# Patient Record
Sex: Female | Born: 1960 | Race: Black or African American | Hispanic: No | Marital: Married | Smoking: Former smoker
Health system: Southern US, Community
[De-identification: ages and names within clinical notes are randomized; demographics above are authoritative.]

## PROBLEM LIST (undated history)

## (undated) DIAGNOSIS — E119 Type 2 diabetes mellitus without complications: Secondary | ICD-10-CM

## (undated) DIAGNOSIS — T7840XA Allergy, unspecified, initial encounter: Secondary | ICD-10-CM

## (undated) HISTORY — DX: Type 2 diabetes mellitus without complications: E11.9

## (undated) HISTORY — PX: TONSILLECTOMY: SUR1361

## (undated) HISTORY — PX: OTHER SURGICAL HISTORY: SHX169

## (undated) HISTORY — DX: Allergy, unspecified, initial encounter: T78.40XA

## (undated) HISTORY — PX: LASIK: SHX215

---

## 2007-08-25 ENCOUNTER — Encounter: Admission: RE | Admit: 2007-08-25 | Discharge: 2007-08-25 | Payer: Self-pay | Admitting: Internal Medicine

## 2008-08-27 ENCOUNTER — Encounter: Admission: RE | Admit: 2008-08-27 | Discharge: 2008-08-27 | Payer: Self-pay | Admitting: Internal Medicine

## 2010-07-20 ENCOUNTER — Encounter: Payer: Self-pay | Admitting: Family Medicine

## 2015-01-25 ENCOUNTER — Other Ambulatory Visit: Payer: Self-pay | Admitting: Ophthalmology

## 2015-01-25 DIAGNOSIS — M25512 Pain in left shoulder: Secondary | ICD-10-CM

## 2015-01-28 ENCOUNTER — Ambulatory Visit
Admission: RE | Admit: 2015-01-28 | Discharge: 2015-01-28 | Disposition: A | Discharge status: Home / Self Care | Source: Ambulatory Visit | Attending: Ophthalmology | Admitting: Ophthalmology

## 2015-01-28 ENCOUNTER — Other Ambulatory Visit: Payer: Self-pay | Admitting: Ophthalmology

## 2015-01-28 DIAGNOSIS — M25512 Pain in left shoulder: Secondary | ICD-10-CM

## 2016-12-08 IMAGING — CT CT CHEST LIMITED W/O CM
2 of 5 series · 9 of 36 positions shown, 11 images · non-contrast
Comparison: None.

CLINICAL DATA: Right worse than left pain and swelling of the
sternoclavicular joints for 3 weeks. No known injury. Initial
encounter.

EXAM:
3-DIMENSIONAL CT IMAGE RENDERING ON INDEPENDENT WORKSTATION
TECHNIQUE: 3-dimensional CT images were rendered by post-processing of the
original CT data on an independent workstation. The 3-dimensional CT
images were interpreted and findings were reported in the
accompanying complete CT report for this study

[Series 7: cor · coronal · 0.22mm/px · 3 of 52 slices shown]
[im 11/52  lung]
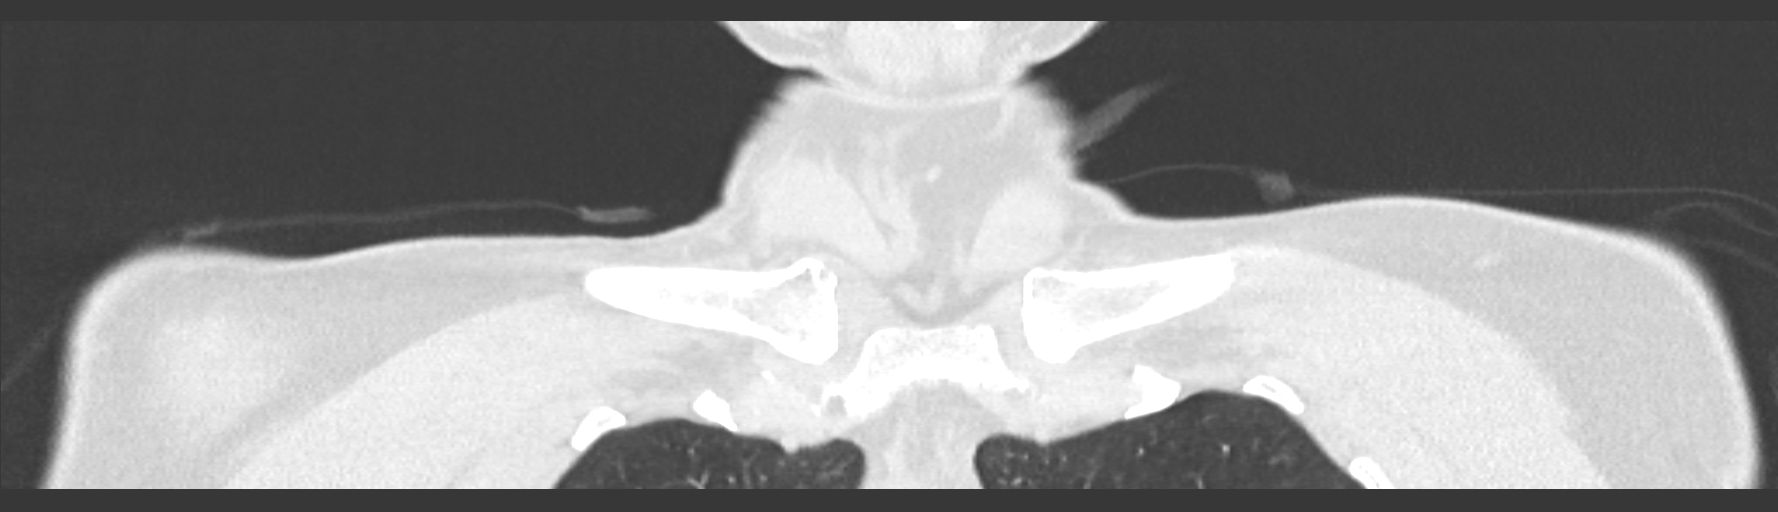
[im 21/52  lung]
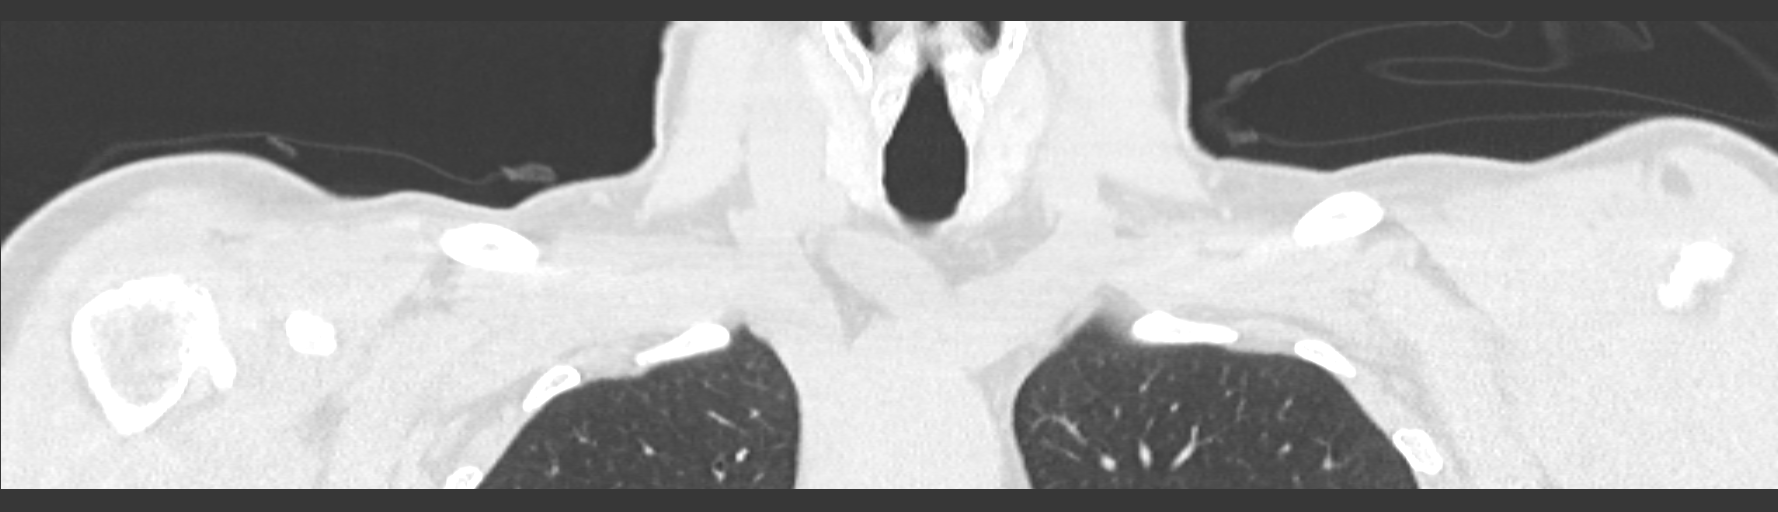
[im 31/52  lung]
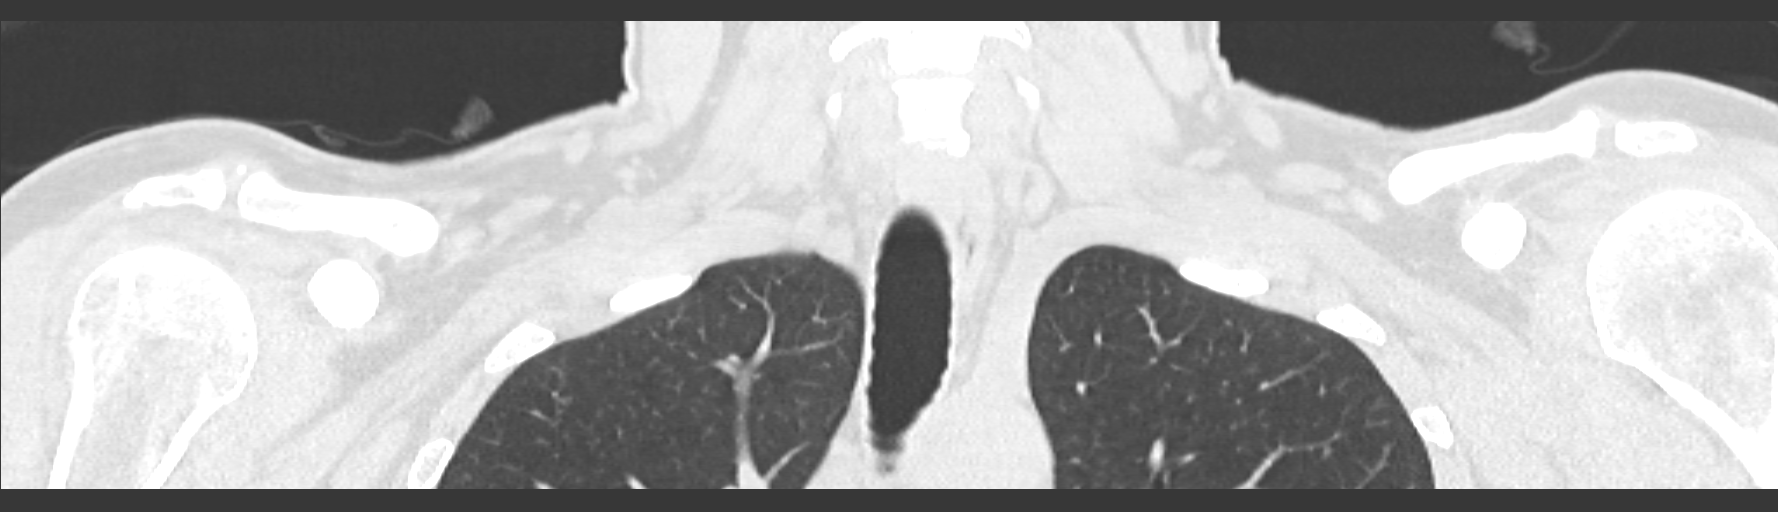

[Series 602: sag · axial · 0.82mm/px · z∈[-103,-71]mm · 6 of 23 slices shown, 8 images]
[im 4/23  mediastinal]
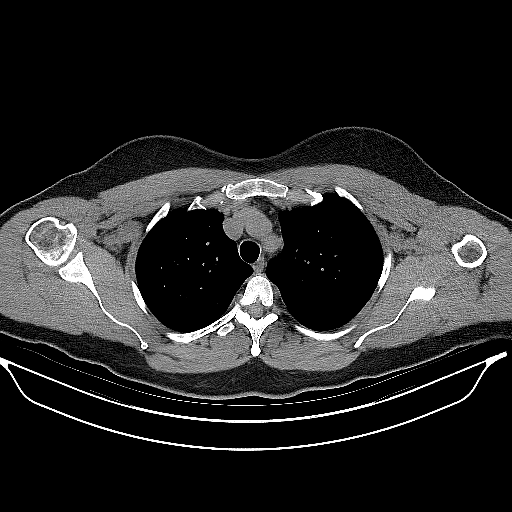
[im 4/23  lung]
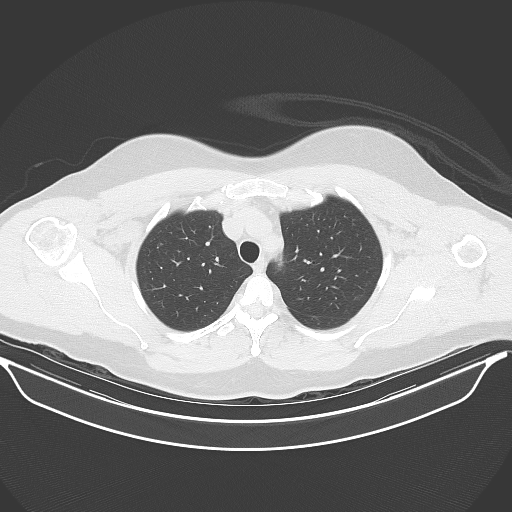
[im 7/23  lung]
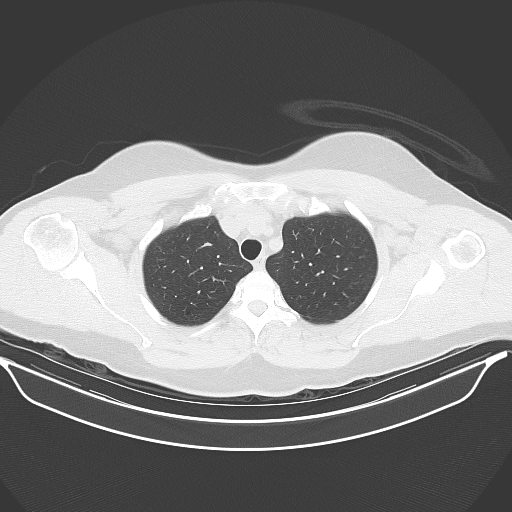
[im 10/23  lung]
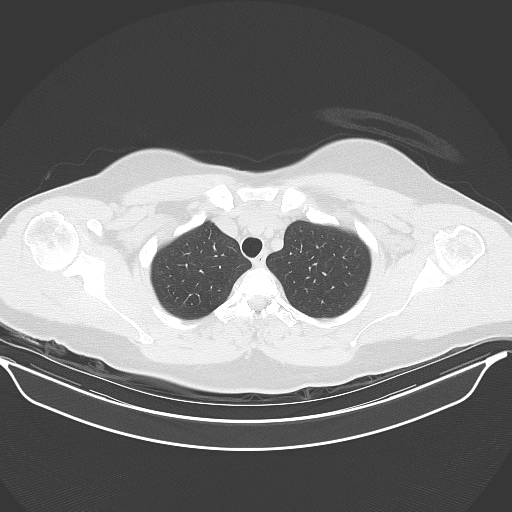
[im 13/23  lung]
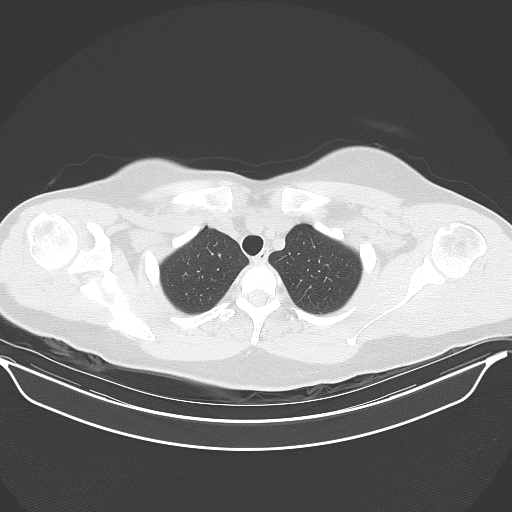
[im 16/23  mediastinal]
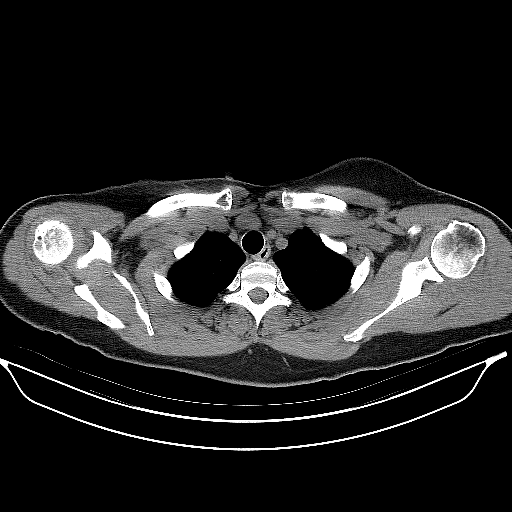
[im 16/23  lung]
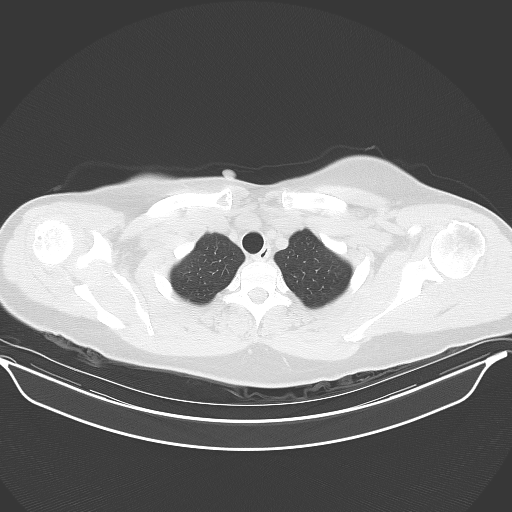
[im 19/23  lung]
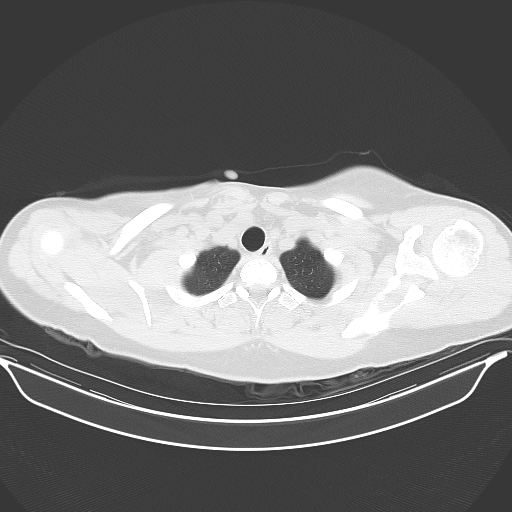

[9 of 36 positions shown; findings below may reference images not displayed]

FINDINGS: The sternoclavicular joints are located bilaterally. There is
degenerative disease about the right sternoclavicular joint with
small subchondral cysts seen in the clavicular head and ossification
of the joint capsule inferiorly. No erosion is identified. No lytic
or sclerotic bony lesion to suggest neoplastic process is seen. No
sternoclavicular joint effusion is identified.

Bilateral acromioclavicular osteoarthritis appears moderate in
degree and somewhat worse on the left. Although not designed to
evaluate the rotator cuff, no tear is identified. Imaged lung
parenchyma is clear. Chest wall soft tissue structures appear
normal.
IMPRESSION: Moderate appearing degenerative change about the right
sternoclavicular joint. The left sternoclavicular joint appears
normal and the sternoclavicular joints are otherwise unremarkable.

Bilateral acromioclavicular osteoarthritis.

3-dimensional CT images were rendered by post-processing of the
original CT data at the CT scanner. The 3-dimensional CT images were
interpreted, and findings were reported in the accompanying complete
CT report for this study.

## 2021-10-21 ENCOUNTER — Encounter: Payer: Self-pay | Admitting: Gastroenterology

## 2021-11-05 ENCOUNTER — Ambulatory Visit (AMBULATORY_SURGERY_CENTER): Payer: Self-pay | Admitting: *Deleted

## 2021-11-05 VITALS — Ht 63.0 in | Wt 160.6 lb

## 2021-11-05 DIAGNOSIS — Z1211 Encounter for screening for malignant neoplasm of colon: Secondary | ICD-10-CM

## 2021-11-05 MED ORDER — NA SULFATE-K SULFATE-MG SULF 17.5-3.13-1.6 GM/177ML PO SOLN: 1.0000 | Freq: Once | ORAL | 0 refills | Status: AC

## 2021-11-05 NOTE — Progress Notes (Signed)

## 2021-12-02 ENCOUNTER — Ambulatory Visit (AMBULATORY_SURGERY_CENTER): Admitting: Gastroenterology

## 2021-12-02 ENCOUNTER — Encounter: Payer: Self-pay | Admitting: Gastroenterology

## 2021-12-02 VITALS — BP 108/68 | HR 71 | Temp 98.6°F | Resp 12 | Ht 63.0 in | Wt 160.6 lb

## 2021-12-02 DIAGNOSIS — Z1211 Encounter for screening for malignant neoplasm of colon: Secondary | ICD-10-CM

## 2021-12-02 MED ORDER — SODIUM CHLORIDE 0.9 % IV SOLN: 500.0000 mL | Freq: Once | INTRAVENOUS | Status: DC

## 2021-12-02 NOTE — Progress Notes (Signed)
Report to PACU, RN, vss, BBS= Clear.  

## 2021-12-02 NOTE — Patient Instructions (Signed)
Handout on diverticulosis given to you today   YOU HAD AN ENDOSCOPIC PROCEDURE TODAY AT THE [facility]:   Refer to the procedure report that was given to you for any specific questions about what was found during the examination.  If the procedure report does not answer your questions, please call your gastroenterologist to clarify.  If you requested that your care partner not be given the details of your procedure findings, then the procedure report has been included in a sealed envelope for you to review at your convenience later.  YOU SHOULD EXPECT: Some feelings of bloating in the abdomen. Passage of more gas than usual.  Walking can help get rid of the air that was put into your GI tract during the procedure and reduce the bloating. If you had a lower endoscopy (such as a colonoscopy or flexible sigmoidoscopy) you may notice spotting of blood in your stool or on the toilet paper. If you underwent a bowel prep for your procedure, you may not have a normal bowel movement for a few days.  Please Note:  You might notice some irritation and congestion in your nose or some drainage.  This is from the oxygen used during your procedure.  There is no need for concern and it should clear up in a day or so.  SYMPTOMS TO REPORT IMMEDIATELY:  Following lower endoscopy (colonoscopy or flexible sigmoidoscopy):  Excessive amounts of blood in the stool  Significant tenderness or worsening of abdominal pains  Swelling of the abdomen that is new, acute  Fever of 100F or higher  For urgent or emergent issues, a gastroenterologist can be reached at any hour by calling [phone]. Do not use MyChart messaging for urgent concerns.    DIET:  We do recommend a small meal at first, but then you may proceed to your regular diet.  Drink plenty of fluids but you should avoid alcoholic beverages for 24 hours.  ACTIVITY:  You should plan to take it easy for the rest of today and you should NOT DRIVE  or use heavy machinery until tomorrow (because of the sedation medicines used during the test).    FOLLOW UP: Our staff will call the number listed on your records 24-72 hours following your procedure to check on you and address any questions or concerns that you may have regarding the information given to you following your procedure. If we do not reach you, we will leave a message.  We will attempt to reach you two times.  During this call, we will ask if you have developed any symptoms of COVID 19. If you develop any symptoms (ie: fever, flu-like symptoms, shortness of breath, cough etc.) before then, please call [phone].  If you test positive for Covid 19 in the 2 weeks post procedure, please call and report this information to [place].    SIGNATURES/CONFIDENTIALITY: You and/or your care partner have signed paperwork which will be entered into your electronic medical record.  These signatures attest to the fact that that the information above on your After Visit Summary has been reviewed and is understood.  Full responsibility of the confidentiality of this discharge information lies with you and/or your care-partner.

## 2021-12-02 NOTE — Progress Notes (Signed)
Gastroenterology History and Physical   Primary Care Physician:  Pcp, No   Reason for Procedure:   Colon cancer screening  Plan:    Screening colonoscopy     HPI: Sharon Villarreal is a 61 y.o. female undergoing average risk screening colonoscopy.  She has no family history of colon cancer and no chronic GI symptoms.  She had a normal colonoscopy at age 78.   Past Medical History:  Diagnosis Date   Allergy    seasonal   Diabetes mellitus without complication (HCC)     Past Surgical History:  Procedure Laterality Date   c sections     x 2   LASIK     TONSILLECTOMY     teenager    Prior to Admission medications   Medication Sig Start Date End Date Taking? Authorizing Provider  metFORMIN (GLUCOPHAGE-XR) 500 MG 24 hr tablet Take 1 tablet by mouth daily with breakfast. 10/24/21  Yes [provider]  Multiple Vitamin (MULTI-VITAMIN) tablet Take 1 tablet by mouth daily.   Yes [provider]  Omega-3 1000 MG CAPS Take by mouth daily.   Yes [provider]  OVER THE COUNTER MEDICATION daily. VEIN FORMULA ONE TABLET   Yes [provider]  fluticasone (FLONASE) 50 MCG/ACT nasal spray Place into the nose as needed. 01/27/18   [provider]    Current Outpatient Medications  Medication Sig Dispense Refill   metFORMIN (GLUCOPHAGE-XR) 500 MG 24 hr tablet Take 1 tablet by mouth daily with breakfast.     Multiple Vitamin (MULTI-VITAMIN) tablet Take 1 tablet by mouth daily.     Omega-3 1000 MG CAPS Take by mouth daily.     OVER THE COUNTER MEDICATION daily. VEIN FORMULA ONE TABLET     fluticasone (FLONASE) 50 MCG/ACT nasal spray Place into the nose as needed.     Current Facility-Administered Medications  Medication Dose Route Frequency Provider Last Rate Last Admin   0.9 %  sodium chloride infusion  500 mL Intravenous Once Jenel Lucks, MD        Allergies as of 12/02/2021 - Review Complete 12/02/2021  Allergen Reaction  Noted   Aspirin effervescent Other (See Comments) 05/08/2013    Family History  Problem Relation Age of Onset   Breast cancer Mother    Bone cancer Mother    Colon cancer Neg Hx    Colon polyps Neg Hx    Crohn's disease Neg Hx    Esophageal cancer Neg Hx    Rectal cancer Neg Hx    Stomach cancer Neg Hx     Social History   Socioeconomic History   Marital status: Married    Spouse name: Not on file   Number of children: Not on file   Years of education: Not on file   Highest education level: Not on file  Occupational History   Not on file  Tobacco Use   Smoking status: Former    Types: Cigarettes    Passive exposure: Never   Smokeless tobacco: Never  Vaping Use   Vaping Use: Never used  Substance and Sexual Activity   Alcohol use: Yes    Comment: rarely-wine   Drug use: Never   Sexual activity: Not on file  Other Topics Concern   Not on file  Social History Narrative   Not on file   Social Determinants of Health   Financial Resource Strain: Not on file  Food Insecurity: Not on file  Transportation Needs: Not  on file  Physical Activity: Not on file  Stress: Not on file  Social Connections: Not on file  Intimate Partner Violence: Not on file    Review of Systems:  All other review of systems negative except as mentioned in the HPI.  Physical Exam: Vital signs BP 119/76   Pulse 78   Temp 98.6 F (37 C) (Temporal)   Ht 5\' 3"  (1.6 m)   Wt 160 lb 9.6 oz (72.8 kg)   LMP 01/12/2015   SpO2 99%   BMI 28.45 kg/m   General:   Alert,  Well-developed, well-nourished, pleasant and cooperative in NAD Airway:  Mallampati 3 Lungs:  Clear throughout to auscultation.   Heart:  Regular rate and rhythm; no murmurs, clicks, rubs,  or gallops. Abdomen:  Soft, nontender and nondistended. Normal bowel sounds.   Neuro/Psych:  Normal mood and affect. A and O x 3   Nickole Adamek E. 01/14/2015, MD [facility] Gastroenterology

## 2021-12-02 NOTE — Progress Notes (Signed)
I have reviewed the patient's medical history in detail and updated the computerized patient record.

## 2021-12-02 NOTE — Op Note (Signed)
[facility] Patient Name: Sharon Villarreal Procedure Date: 12/02/2021 9:38 AM MRN: [id number] Endoscopist: Lorin PicketScott E. Tomasa Randunningham , MD Age: [age] Referring MD:  Date of Birth: 1960/11/21 Gender: Female Account #: [id number] Procedure:                Colonoscopy Indications:              Screening for colorectal malignant neoplasm (last                            colonoscopy was 10 years ago) Medicines:                Monitored Anesthesia Care Procedure:                Pre-Anesthesia Assessment:                           - Prior to the procedure, a History and Physical                            was performed, and patient medications and                            allergies were reviewed. The patient's tolerance of                            previous anesthesia was also reviewed. The risks                            and benefits of the procedure and the sedation                            options and risks were discussed with the patient.                            All questions were answered, and informed consent                            was obtained. Prior Anticoagulants: The patient has                            taken no previous anticoagulant or antiplatelet                            agents. ASA Grade Assessment: II - A patient with                            mild systemic disease. After reviewing the risks                            and benefits, the patient was deemed in                            satisfactory condition to undergo the procedure.  After obtaining informed consent, the colonoscope                            was passed under direct vision. Throughout the                            procedure, the patient's blood pressure, pulse, and                            oxygen saturations were monitored continuously. The                            CF [id number] #[id number] was introduced through the anus                            and advanced to the the  terminal ileum, with                            identification of the appendiceal orifice and IC                            valve. The colonoscopy was performed without                            difficulty. The patient tolerated the procedure                            well. The quality of the bowel preparation was                            good. The terminal ileum, ileocecal valve,                            appendiceal orifice, and rectum were photographed.                            The bowel preparation used was SUPREP via split                            dose instruction. Scope In: 9:54:13 AM Scope Out: 10:04:31 AM Scope Withdrawal Time: 0 hours 7 minutes 32 seconds  Total Procedure Duration: 0 hours 10 minutes 18 seconds  Findings:                 Skin tags were found on perianal exam.                           The digital rectal exam was normal. Pertinent                            negatives include normal sphincter tone and no                            palpable rectal lesions.  A few small-mouthed diverticula were found in the                            sigmoid colon and descending colon.                           The exam was otherwise normal throughout the                            examined colon.                           The terminal ileum appeared normal.                           The retroflexed view of the distal rectum and anal                            verge was normal and showed no anal or rectal                            abnormalities. Complications:            No immediate complications. Estimated Blood Loss:     Estimated blood loss: none. Impression:               - Perianal skin tags found on perianal exam.                           - Mild diverticulosis in the sigmoid colon and in                            the descending colon.                           - The examined portion of the ileum was normal.                           -  The distal rectum and anal verge are normal on                            retroflexion view.                           - No specimens collected. Recommendation:           - Patient has a contact number available for                            emergencies. The signs and symptoms of potential                            delayed complications were discussed with the                            patient. Return to normal activities tomorrow.  Written discharge instructions were provided to the                            patient.                           - Resume previous diet.                           - Continue present medications.                           - Repeat colonoscopy in 10 years for screening                            purposes. Olivene Cookston E. Tomasa Rand, MD 12/02/2021 10:08:32 AM This report has been signed electronically.

## 2021-12-03 ENCOUNTER — Telehealth: Payer: Self-pay

## 2021-12-03 NOTE — Telephone Encounter (Signed)
  Follow up Call-     12/02/2021    8:56 AM  Call back number  Post procedure Call Back phone  # [phone]  Permission to leave phone message Yes     Patient questions:  Do you have a fever, pain , or abdominal swelling? No. Pain Score  0 *  Have you tolerated food without any problems? Yes.    Have you been able to return to your normal activities? Yes.    Do you have any questions about your discharge instructions: Diet   No. Medications  No. Follow up visit  No.  Do you have questions or concerns about your Care? No.  Actions: * If pain score is 4 or above: No action needed, pain <4.

## 2024-10-30 ENCOUNTER — Ambulatory Visit: Admitting: Dermatology
